# Patient Record
Sex: Male | Born: 1975 | Race: White | Hispanic: No | Marital: Married
Health system: Midwestern US, Academic
[De-identification: ages and names within clinical notes are randomized; demographics above are authoritative.]

---

## 2014-06-03 ENCOUNTER — Encounter (HOSPITAL_COMMUNITY): Payer: Self-pay

## 2014-06-03 ENCOUNTER — Emergency Department (INDEPENDENT_AMBULATORY_CARE_PROVIDER_SITE_OTHER): Payer: BLUE CROSS/BLUE SHIELD

## 2014-06-03 ENCOUNTER — Emergency Department (INDEPENDENT_AMBULATORY_CARE_PROVIDER_SITE_OTHER)
Admission: EM | Admit: 2014-06-03 | Discharge: 2014-06-03 | Disposition: A | Discharge status: Home / Self Care | Payer: BLUE CROSS/BLUE SHIELD | Source: Home / Self Care | Attending: Emergency Medicine | Admitting: Emergency Medicine

## 2014-06-03 DIAGNOSIS — M779 Enthesopathy, unspecified: Secondary | ICD-10-CM

## 2014-06-03 DIAGNOSIS — M778 Other enthesopathies, not elsewhere classified: Secondary | ICD-10-CM

## 2014-06-03 IMAGING — DX DG HAND COMPLETE 3+V*L*
3 series · 3 of 3 positions shown · non-contrast
Comparison: None.

CLINICAL DATA: Pain with bruising and swelling on the dorsal aspect
of the hand since blunt trauma 3 months ago.

EXAM:
LEFT HAND - COMPLETE 3+ VIEW

[hand pa]
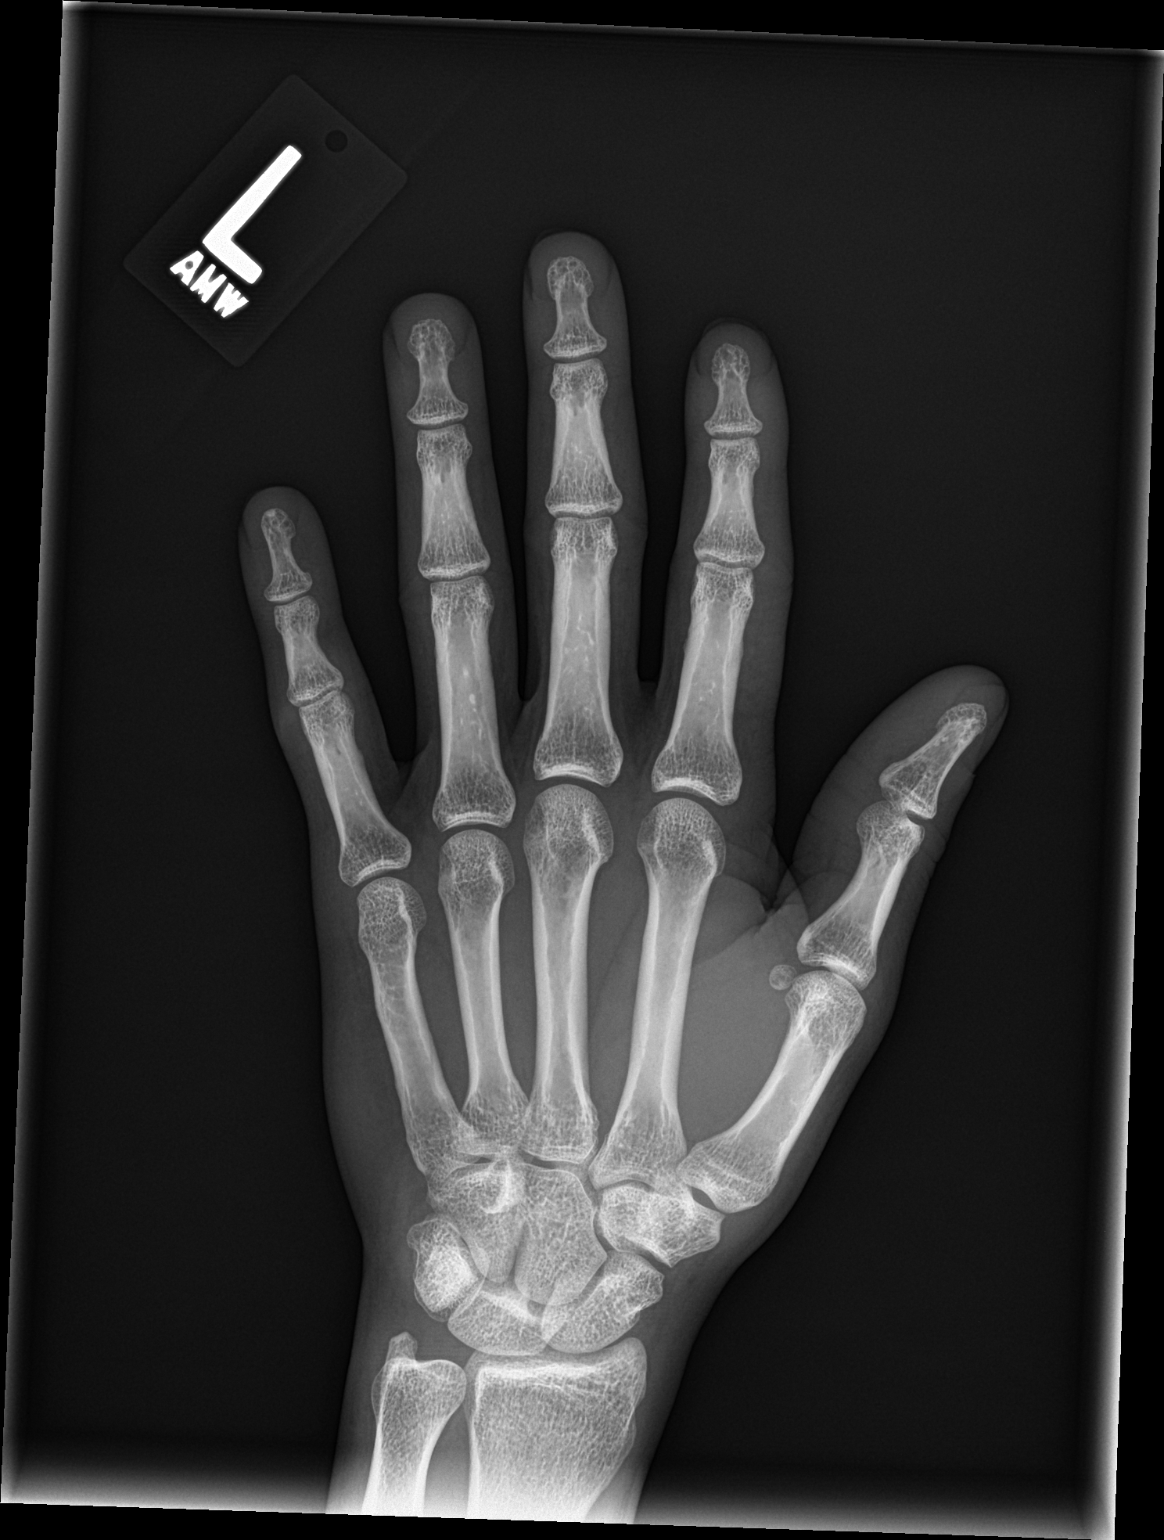

[hand obl]
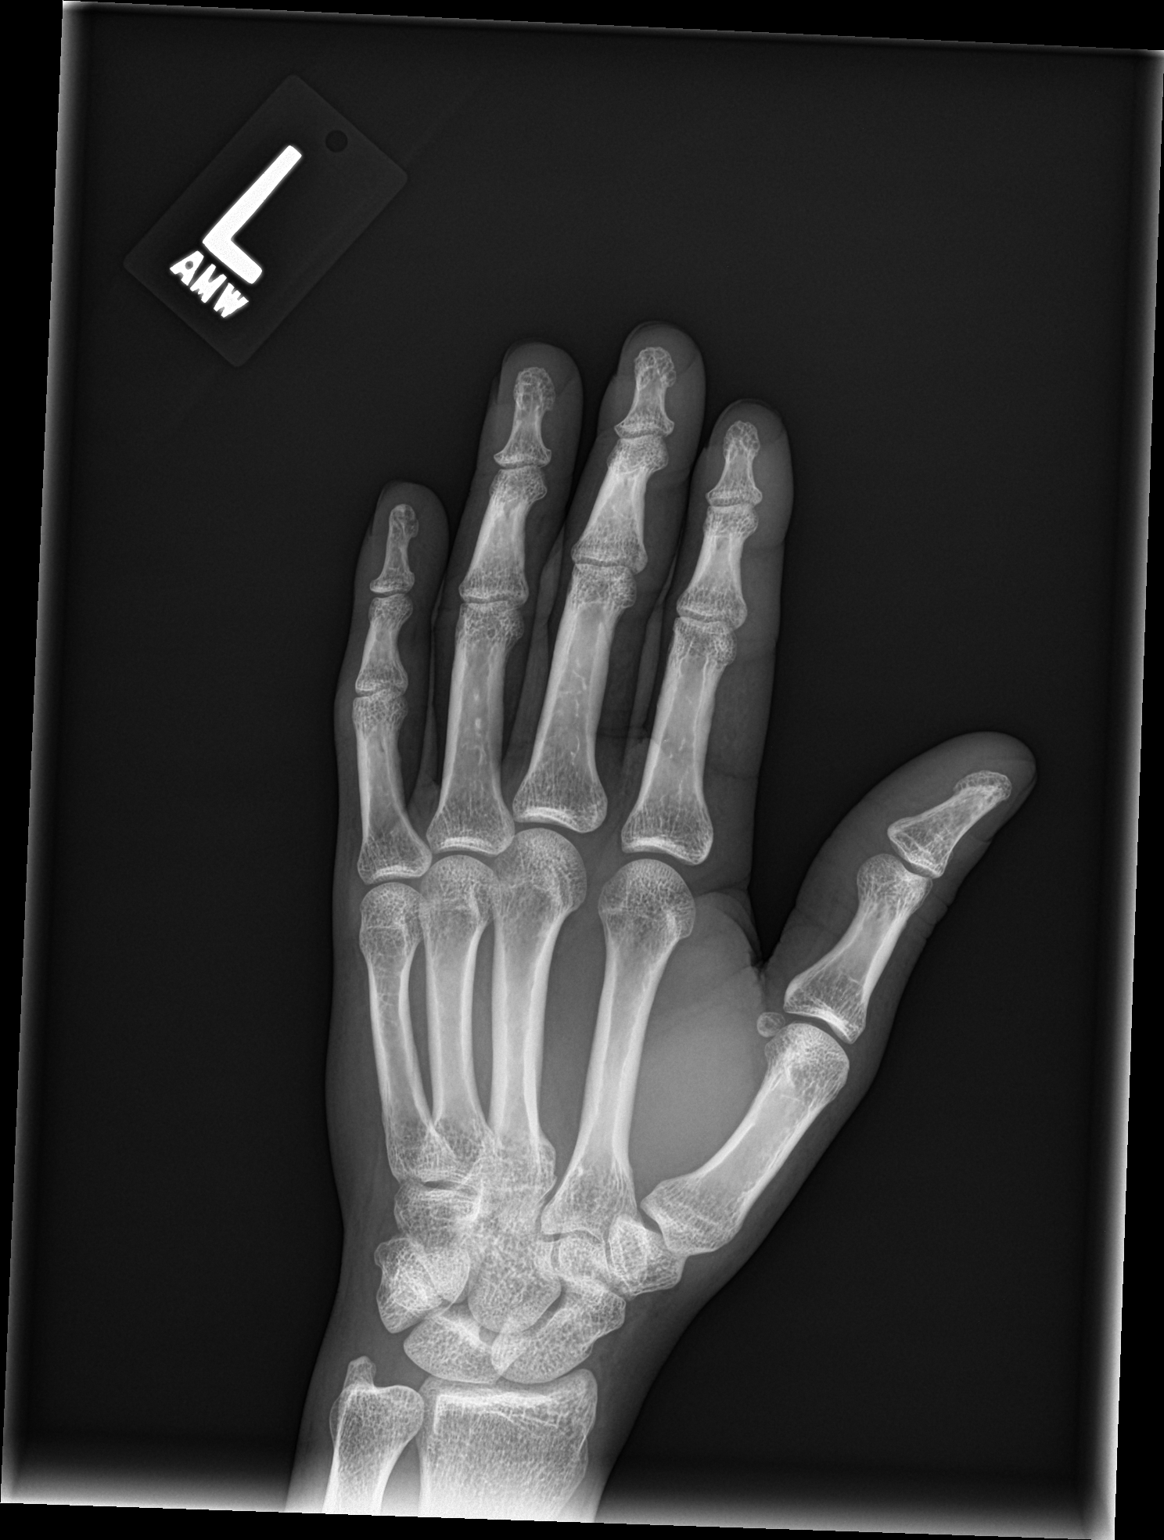

[hand lat]
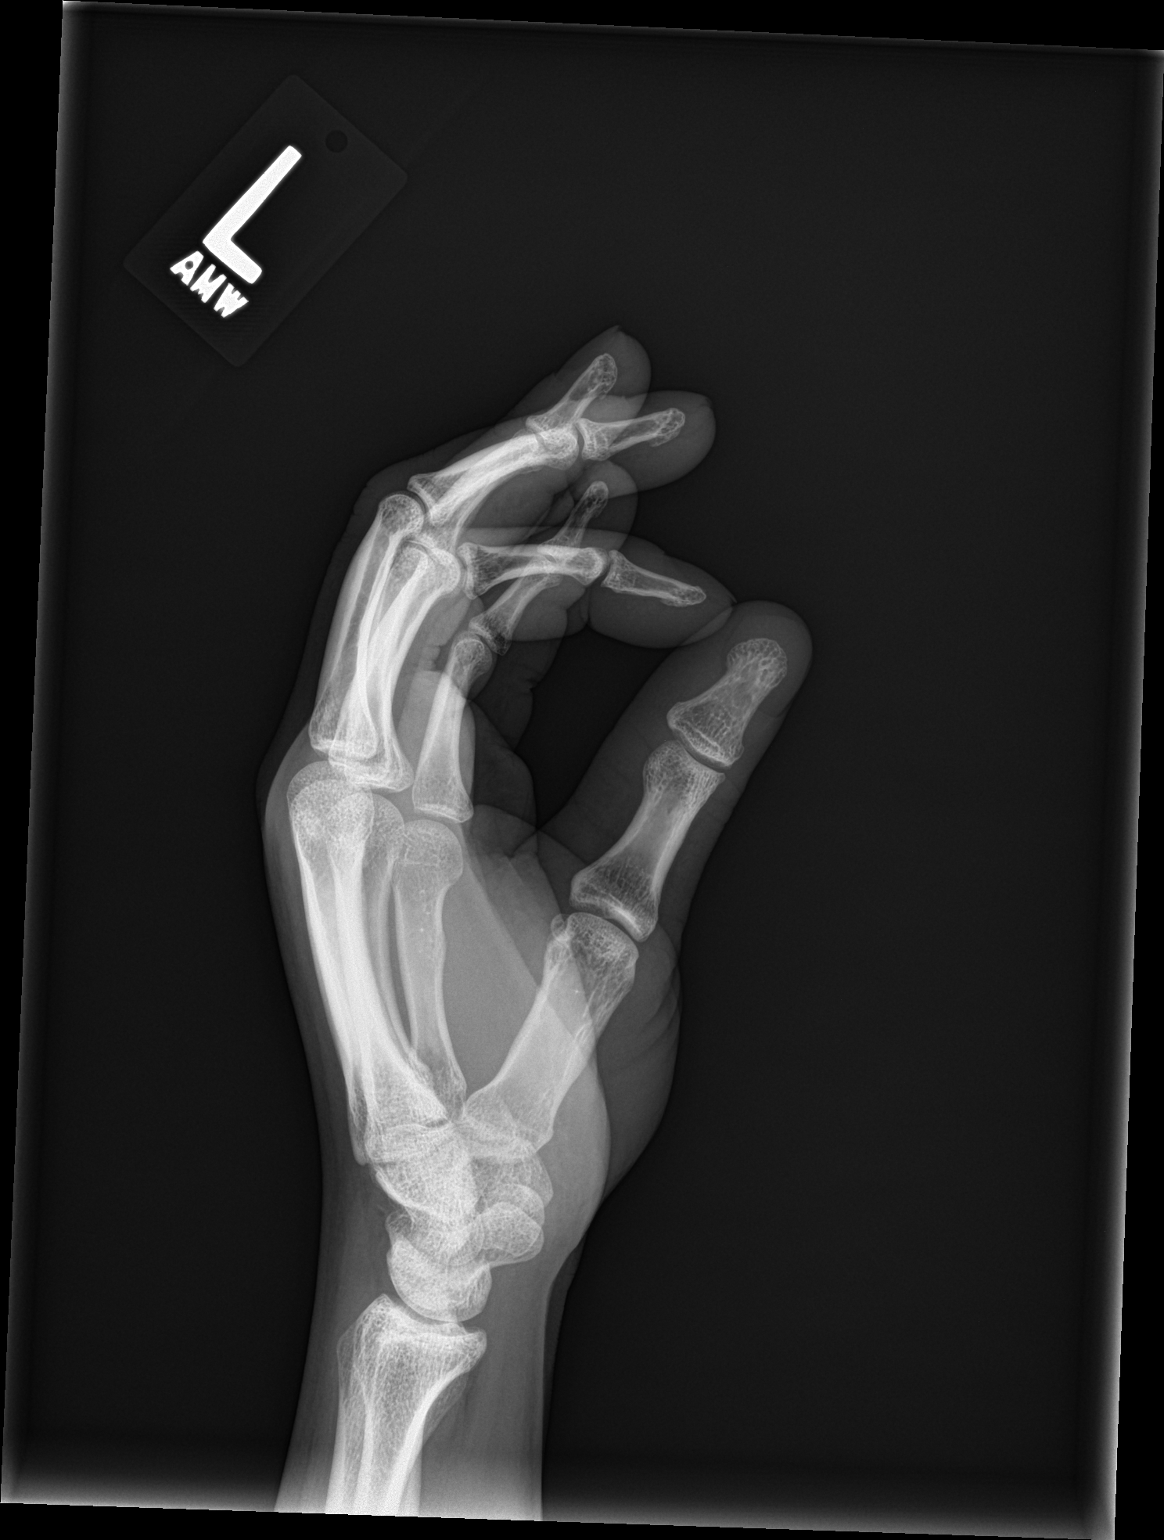

[3 of 3 positions shown; findings below may reference images not displayed]

FINDINGS: There is no evidence of fracture or dislocation. There is no
evidence of arthropathy or other focal bone abnormality. Soft
tissues are unremarkable.
IMPRESSION: Negative.

## 2014-06-03 MED ORDER — TRAMADOL HCL 50 MG PO TABS: 50.0000 mg | ORAL_TABLET | Freq: Four times a day (QID) | ORAL | Status: DC | PRN

## 2014-06-03 MED ORDER — DICLOFENAC SODIUM 75 MG PO TBEC: 75.0000 mg | DELAYED_RELEASE_TABLET | Freq: Two times a day (BID) | ORAL | Status: DC

## 2014-06-03 MED ORDER — KETOROLAC TROMETHAMINE 60 MG/2ML IM SOLN
60.0000 mg | Freq: Once | INTRAMUSCULAR | Status: AC
  Administered 2014-06-03: 60 mg via INTRAMUSCULAR

## 2014-06-03 MED ORDER — KETOROLAC TROMETHAMINE 60 MG/2ML IM SOLN
INTRAMUSCULAR | Status: AC
  Filled 2014-06-03: qty 2

## 2014-06-03 NOTE — ED Notes (Signed)
States he injured his left hand in early November. accidently struck machine at work while his hand was swinging when he was walking. Has not filed WC, and does not plan to at this point in time. C/o pain is "8" on pan scale

## 2014-06-03 NOTE — ED Notes (Signed)
Splint to finger by MD

## 2014-06-03 NOTE — Discharge Instructions (Signed)
You have tendinitis of your index finger. Wear the brace only when at work and at night. Remove it 2-3 times a day to gently flex and extend your finger. Apply ice to the area 2-3 times a day. Take diclofenac twice a day for the next week, starting this evening. Take tramadol every 6 hours as needed for severe pain. Do not take this medication while driving or at work. You should see improvement in the next 1-2 weeks.   Extensor Pollicis Longus Tendinitis Extensor pollicis longus (EPL) tendonitis is a condition in which the EPL tendon lining (sheath) becomes inflamed. This causes pain on the thumb side (radial side) of the back of the wrist. The EPL tendon attaches the EPL muscle to the bone. The EPL muscle straightens the thumb. The tendon lining secretes a lubricant that allows the EPL to glide smoothly. When the lining becomes inflamed, the tendon can not glide smoothly, which causes pain. There are three grades of EPL tendonitis. A grade 1 strain is a mild strain, where the tendon experiences a slight pull, but no obvious tearing (microscopic tearing). There is no loss of strength, and the tendon is the correct length. Grade 2 strains are a moderate strain. There is tearing of tendon fibers within the tendon or where the tendon meets the bone or muscle. Tearing of the tendon causes the length of the whole muscle-tendon-bone unit to increase. A grade 2 injury usually results in decreased strength. A grade 3 strain is a complete rupture of the tendon.  CAUSES   EPL tendinitis is often an overuse injury and caused by repeated motions of the hand and wrist, due to friction of the tendon within the lining.  Sudden increase in activity or change in activity. SYMPTOMS   Vague, diffuse pain and tenderness over the thumb side of the back of the wrist.  Pain that gets worse when straightening the thumb.  Locking, catching, or triggering of the thumb joint.  Limited motion of the thumb.  Little or  no swelling, warmth, or redness of the back of the wrist.  Crackling sound (crepitation) when the tendon or wrist is moved or touched. RISK INCREASES WITH:  Sports that involve repeated hand and wrist motions (tennis, golf, bowling).  Previous wrist injury.  Heavy labor.  Poor wrist strength and flexibility.  Failure to warm up properly before activity. PREVENTION   Warm up and stretch properly before activity.  Allow time for adequate rest and recovery between practices and competition.  Maintain physical fitness:  Forearm, wrist, and hand flexibility.  Muscle strength and endurance.  Learn and use proper exercise technique. PROGNOSIS  This condition is often curable within 6 weeks, if treated properly with non-surgical treatment and resting the affected area. Surgery may be advised. RELATED COMPLICATIONS   Longer healing time, if not properly treated, or if not given adequate time to heal.  Chronic inflammation of the EPL tendon, causing pain.  Recurring of symptoms, especially if activity is resumed too soon.  Risks of surgery: infection, bleeding, injury to nerves (numbness of the thumb), continued pain, incomplete release of the tendon lining, recurring symptoms, cutting of the tendon, thumb weakness, thumb stiffness, and inability to straighten the thumb. TREATMENT  Treatment first involves ice and medicine to reduce pain and inflammation. Stretching and strengthening exercises, along with activity modification, may be advised to reduce painful symptoms. For certain cases, a brace, splint, or taping may be advised, to reduce wrist movement during activity. Your caregiver may recommend  a corticosteroid injection to reduce inflammation. If non-surgical treatment is unsuccessful, surgery may be advised to release the inflamed tendon. If the tendon is torn, it may require repair or replacement (reconstruction). MEDICATION   If pain medicine is needed, nonsteroidal  anti-inflammatory medicines, (aspirin and ibuprofen), or other minor pain relievers (acetaminophen), are often advised.  Do not take pain medicine for 7 days before surgery.  Prescription pain relievers are usually only prescribed after surgery. Use only as directed and only as much as you need.  Cortisone injections reduce inflammation. However, these are used only in extreme cases, because there is a limit to the number of times they may be given. These injections may weaken muscle and tendon tissue. Anesthetics also temporarily relieve pain. COLD THERAPY  Cold treatment (icing) relieves pain and reduces inflammation. Cold treatment should be applied for 10 to 15 minutes every 2 to 3 hours, and immediately after activity that aggravates your symptoms. Use ice packs or an ice massage. SEEK MEDICAL CARE IF:   Symptoms get worse or do not improve in 2 to 4 weeks, despite treatment.  You experience pain, numbness, or coldness in the hand.  Blue, gray, or dark color appears in the fingernails.  Any of the following occur after surgery: increased pain, swelling, redness, drainage of fluids, bleeding in the affected area, or signs of infection.  New, unexplained symptoms develop. (Drugs used in treatment may produce side effects.) Document Released: 03/22/2005 Document Revised: 06/14/2011 Document Reviewed: 07/04/2008 [facility] Patient Information 2015 [place], [place]. This information is not intended to replace advice given to you by your health care provider. Make sure you discuss any questions you have with your health care provider.

## 2014-06-03 NOTE — ED Provider Notes (Signed)
CSN: [id number]     Arrival date & time 06/03/14  0808 History   First MD Initiated Contact with Patient 06/03/14 [phone]     Chief Complaint  Patient presents with  . Hand Problem   (Consider location/radiation/quality/duration/timing/severity/associated sxs/prior Treatment) HPI He is a 39 year old man here for evaluation of left hand injury. He states he hit it on something at work about 3 months ago. He initially had pain along the radial aspect of his index finger. Over time, the pain has moved to his second metacarpal area. He reports pain with moving his index finger. His thumb and other fingers are okay. He does note some swelling over the area. Moving his wrist causes minimal discomfort. He has tried Tylenol with minimal improvement. He states sometimes the pain is okay, and other times it is excruciating. He has difficulty opening jars and cans due to the pain. He reports some occasional tingling in his hand. He is being seen now because his insurance kicked in.  History reviewed. No pertinent past medical history. History reviewed. No pertinent past surgical history. History reviewed. No pertinent family history. History  Substance Use Topics  . Smoking status: Never Smoker   . Smokeless tobacco: Not on file  . Alcohol Use: Yes    Review of Systems As in history of present illness Allergies  Review of patient's allergies indicates no known allergies.  Home Medications   Prior to Admission medications   Medication Sig Start Date End Date Taking? Authorizing Provider  diclofenac (VOLTAREN) 75 MG EC tablet Take 1 tablet (75 mg total) by mouth 2 (two) times daily. 06/03/14   Charm Rings, MD  traMADol (ULTRAM) 50 MG tablet Take 1 tablet (50 mg total) by mouth every 6 (six) hours as needed for severe pain. 06/03/14   Charm Rings, MD   BP 108/63 mmHg  Pulse 61  Temp(Src) 98 F (36.7 C) (Oral)  Resp 14  SpO2 98% Physical Exam  Constitutional: He is oriented to person, place,  and time. He appears well-developed and well-nourished. He appears distressed (cradling hand).  Cardiovascular: Normal rate.   Pulmonary/Chest: Effort normal.  Musculoskeletal:  Left hand: No erythema. Some mild swelling over the second metacarpal on the dorsal aspect of his hand. He is tender diffusely over the dorsal hand, but worse over the first metacarpal and radial carpal bones. He has significant pain with passive flexion and extension of his index finger. Brisk cap refill in all digits. 2+ radial pulse. Sensation grossly intact to light touch.  Neurological: He is alert and oriented to person, place, and time.    ED Course  Procedures (including critical care time) Labs Review Labs Reviewed - No data to display  Imaging Review Dg Hand Complete Left  06/03/2014   CLINICAL DATA:  Pain with bruising and swelling on the dorsal aspect of the hand since blunt trauma 3 months ago.  EXAM: LEFT HAND - COMPLETE 3+ VIEW  COMPARISON:  None.  FINDINGS: There is no evidence of fracture or dislocation. There is no evidence of arthropathy or other focal bone abnormality. Soft tissues are unremarkable.  IMPRESSION: Negative.   Electronically Signed   By: Francene Boyers M.D.   On: 06/03/2014 08:48     MDM   1. Tendonitis of finger    Toradol 60 mg IM given.  X-rays negative for fracture. He likely has tendinitis of his extensor pollicis longus. Splint placed here. Conservative management with diclofenac, ice, splinting for the next week.  Prescription for tramadol provided to use as needed for severe pain. Follow-up if no improvement in 1-2 weeks.    Charm RingsErin J Aneli Zara, MD 06/03/14 [phone]

## 2015-08-26 ENCOUNTER — Ambulatory Visit
Admission: RE | Admit: 2015-08-26 | Discharge: 2015-08-26 | Disposition: A | Discharge status: Home / Self Care | Payer: BLUE CROSS/BLUE SHIELD | Source: Ambulatory Visit | Attending: Family Medicine | Admitting: Family Medicine

## 2015-08-26 ENCOUNTER — Other Ambulatory Visit: Payer: Self-pay | Admitting: Family Medicine

## 2015-08-26 DIAGNOSIS — M25561 Pain in right knee: Secondary | ICD-10-CM

## 2015-08-26 DIAGNOSIS — R2689 Other abnormalities of gait and mobility: Secondary | ICD-10-CM

## 2015-08-26 IMAGING — CR DG KNEE COMPLETE 4+V*L*
1 series · 1 of 1 positions shown · non-contrast
Comparison: None in PACs

CLINICAL DATA: Chronic left knee pain without known injury;
occasional swelling and difficulty standing.

EXAM:
LEFT KNEE - COMPLETE 4+ VIEW

[view not recorded]
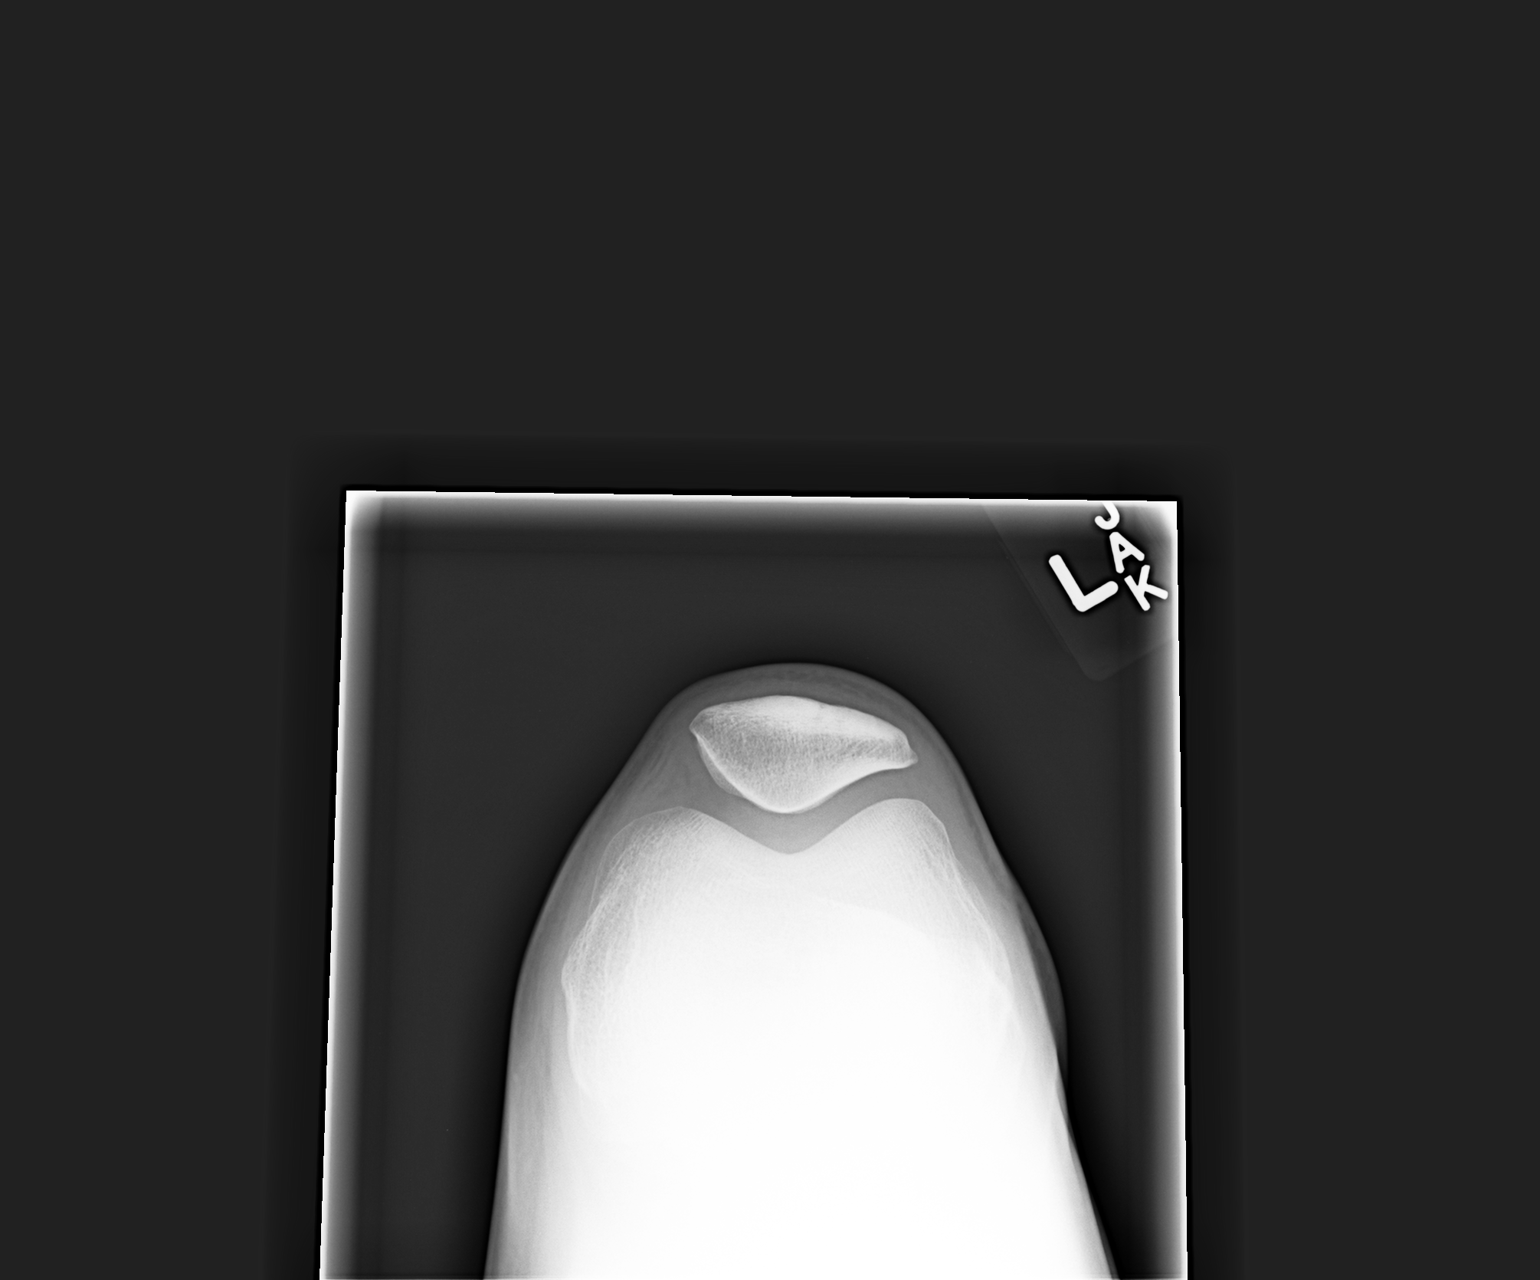

[1 of 1 positions shown; findings below may reference images not displayed]

FINDINGS: The bones are adequately mineralized. There is no acute or healing
fracture nor dislocation. There is mild narrowing of the medial
joint compartment. There is beaking of the medial tibial spine.
There is no chondrocalcinosis or joint effusion. The patella appears
appropriately positioned.
IMPRESSION: There is minimal degenerative change of the left knee centered on
the medial joint compartment.

## 2015-10-09 ENCOUNTER — Emergency Department
Admission: EM | Admit: 2015-10-09 | Discharge: 2015-10-09 | Disposition: A | Discharge status: Home / Self Care | Payer: BLUE CROSS/BLUE SHIELD | Attending: Emergency Medicine | Admitting: Emergency Medicine

## 2015-10-09 DIAGNOSIS — E869 Volume depletion, unspecified: Secondary | ICD-10-CM | POA: Diagnosis not present

## 2015-10-09 DIAGNOSIS — R55 Syncope and collapse: Secondary | ICD-10-CM

## 2015-10-09 DIAGNOSIS — Z5181 Encounter for therapeutic drug level monitoring: Secondary | ICD-10-CM | POA: Diagnosis not present

## 2015-10-09 LAB — COMPREHENSIVE METABOLIC PANEL
ALK PHOS: 57 U/L (ref 38–126)
ALT: 35 U/L (ref 17–63)
AST: 37 U/L (ref 15–41)
Albumin: 4.6 g/dL (ref 3.5–5.0)
Anion gap: 7 (ref 5–15)
BUN: 16 mg/dL (ref 6–20)
CALCIUM: 9.2 mg/dL (ref 8.9–10.3)
CHLORIDE: 105 mmol/L (ref 101–111)
CO2: 26 mmol/L (ref 22–32)
CREATININE: 0.78 mg/dL (ref 0.61–1.24)
GFR calc Af Amer: >60 mL/min (ref >60)
Glucose, Bld: 96 mg/dL (ref 65–99)
Potassium: 3.6 mmol/L (ref 3.5–5.1)
SODIUM: 138 mmol/L (ref 135–145)
Total Bilirubin: 1 mg/dL (ref 0.3–1.2)
Total Protein: 7.7 g/dL (ref 6.5–8.1)

## 2015-10-09 LAB — URINE DRUG SCREEN, QUALITATIVE (ARMC ONLY)
AMPHETAMINES, UR SCREEN: NOT DETECTED
Barbiturates, Ur Screen: NOT DETECTED
Benzodiazepine, Ur Scrn: NOT DETECTED
CANNABINOID 50 NG, UR ~~LOC~~: NOT DETECTED
COCAINE METABOLITE, UR ~~LOC~~: NOT DETECTED
MDMA (Ecstasy)Ur Screen: NOT DETECTED
Methadone Scn, Ur: NOT DETECTED
OPIATE, UR SCREEN: NOT DETECTED
PHENCYCLIDINE (PCP) UR S: NOT DETECTED
Tricyclic, Ur Screen: NOT DETECTED

## 2015-10-09 LAB — BASIC METABOLIC PANEL
ANION GAP: 4 — AB (ref 5–15)
BUN: 14 mg/dL (ref 6–20)
CHLORIDE: 110 mmol/L (ref 101–111)
CO2: 26 mmol/L (ref 22–32)
Calcium: 8.7 mg/dL — ABNORMAL LOW (ref 8.9–10.3)
Creatinine, Ser: 0.81 mg/dL (ref 0.61–1.24)
GFR calc Af Amer: >60 mL/min (ref >60)
GLUCOSE: 78 mg/dL (ref 65–99)
POTASSIUM: 4.4 mmol/L (ref 3.5–5.1)
Sodium: 140 mmol/L (ref 135–145)

## 2015-10-09 LAB — URINALYSIS COMPLETE WITH MICROSCOPIC (ARMC ONLY)
BILIRUBIN URINE: NEGATIVE
Bacteria, UA: NONE SEEN
GLUCOSE, UA: NEGATIVE mg/dL
HGB URINE DIPSTICK: NEGATIVE
KETONES UR: NEGATIVE mg/dL
LEUKOCYTES UA: NEGATIVE
Nitrite: NEGATIVE
Protein, ur: NEGATIVE mg/dL
SPECIFIC GRAVITY, URINE: 1.024 (ref 1.005–1.030)
pH: 6 (ref 5.0–8.0)

## 2015-10-09 LAB — CBC WITH DIFFERENTIAL/PLATELET
BASOS ABS: 0.1 10*3/uL (ref 0–0.1)
Basophils Relative: 2 %
EOS ABS: 0.3 10*3/uL (ref 0–0.7)
EOS PCT: 7 %
HCT: 41.4 % (ref 40.0–52.0)
Hemoglobin: 14.4 g/dL (ref 13.0–18.0)
Lymphocytes Relative: 33 %
Lymphs Abs: 1.5 10*3/uL (ref 1.0–3.6)
MCH: 31 pg (ref 26.0–34.0)
MCHC: 34.8 g/dL (ref 32.0–36.0)
MCV: 88.9 fL (ref 80.0–100.0)
Monocytes Absolute: 0.4 10*3/uL (ref 0.2–1.0)
Monocytes Relative: 8 %
Neutro Abs: 2.3 10*3/uL (ref 1.4–6.5)
Neutrophils Relative %: 50 %
PLATELETS: 206 10*3/uL (ref 150–440)
RBC: 4.65 MIL/uL (ref 4.40–5.90)
RDW: 12.8 % (ref 11.5–14.5)
WBC: 4.5 10*3/uL (ref 3.8–10.6)

## 2015-10-09 LAB — ETHANOL: Alcohol, Ethyl (B): <5 mg/dL (ref <5)

## 2015-10-09 LAB — CK
CK TOTAL: 912 U/L — AB (ref 49–397)
Total CK: 790 U/L — ABNORMAL HIGH (ref 49–397)

## 2015-10-09 MED ORDER — SODIUM CHLORIDE 0.9 % IV BOLUS (SEPSIS)
1000.0000 mL | INTRAVENOUS | Status: AC
  Administered 2015-10-09: 1000 mL via INTRAVENOUS

## 2015-10-09 MED ORDER — SODIUM CHLORIDE 0.9 % IV BOLUS (SEPSIS)
1000.0000 mL | Freq: Once | INTRAVENOUS | Status: AC
  Administered 2015-10-09: 1000 mL via INTRAVENOUS

## 2015-10-09 NOTE — ED Notes (Signed)
Pt [facility] ED [facility] [place] where [place] 15 hour days. Pt states he clocked in at 3:55 and wasn't feel well but "brushed it off", stated he felt sick and wanted to go home. Pt stated that he put a wet towel on his head, sat down in a chair, EMS states that someone helped him down to the floor so that there was no head trauma. Pt does not remember passing out. Pt is alert and oriented upon arrival, EMS states upon arrival of them to pts work he was lethargic and "out of it". EMS states that pt works 15 hr days, ate pancakes and eggs for breakfast. HR was in 50's with EMS, BP 130/80, CBG 91. Pt not c/o of any pain. Pt is stating at this time that he feels lightheaded.

## 2015-10-09 NOTE — ED Provider Notes (Signed)
[facility] Emergency Department Provider Note  ____________________________________________  Time seen: Approximately 6:30 AM  I have reviewed the triage vital signs and the nursing notes.   HISTORY  Chief Complaint Near Syncope    HPI Eduardo Jacobs is a 40 y.o. male who denies any chronic medical problems who presents by EMS for evaluation of near syncope . He works long 15+ hour days in the heat and has not been sleeping well.  He got to work early this morning and a co-worker observed him nearly collapse and almost pass out.  No complete LOC, no traumatic injuries.  EMS reports normal FSBS and vitals but patient reported feeling lightheaded and seemed a little "off" initially although EMS reports he seems better now.  He states he thinks he is just dehydrated and tired.  Denies fever/chills, CP, SOB, abd pain, N/V/D, visual changes, weakness/numbness/tingling in his extremities.  Denies any alcohol consumption and the use of any illicit drugs.  Non-smoker.  No cardiac history.   History reviewed. No pertinent past medical history.  There are no active problems to display for this patient.   History reviewed. No pertinent past surgical history.  Current Outpatient Rx  Name  Route  Sig  Dispense  Refill  . diclofenac (VOLTAREN) 75 MG EC tablet   Oral   Take 1 tablet (75 mg total) by mouth 2 (two) times daily.   30 tablet   0   . traMADol (ULTRAM) 50 MG tablet   Oral   Take 1 tablet (50 mg total) by mouth every 6 (six) hours as needed for severe pain.   20 tablet   0     Allergies Review of patient's allergies indicates no known allergies.  History reviewed. No pertinent family history.  Social History Social History  Substance Use Topics  . Smoking status: Never Smoker   . Smokeless tobacco: None  . Alcohol Use: Yes    Review of Systems Constitutional: No fever/chills Eyes: No visual changes. ENT: No sore throat. Cardiovascular:  Denies chest pain. Respiratory: Denies shortness of breath. Gastrointestinal: No abdominal pain.  No nausea, no vomiting.  No diarrhea.  No constipation. Genitourinary: Negative for dysuria. Musculoskeletal: Negative for back pain. Skin: Negative for rash. Neurological: Negative for headaches, focal weakness or numbness.  Lightheaded, near syncopal episode  10-point ROS otherwise negative.  ____________________________________________   PHYSICAL EXAM:  ED Triage Vitals  Enc Vitals Group     BP 10/09/15 0640 110/78 mmHg     Pulse Rate 10/09/15 0640 56     Resp 10/09/15 0640 18     Temp 10/09/15 0640 97.8 F (36.6 C)     Temp Source 10/09/15 0640 Oral     SpO2 10/09/15 0640 100 %     Weight 10/09/15 0640 143 lb (64.864 kg)     Height 10/09/15 0640 5\' 7"  (1.702 m)     Head Cir --      Peak Flow --      Pain Score 10/09/15 0640 0     Pain Loc --      Pain Edu? --      Excl. in GC? --      Constitutional: Alert and oriented. Well appearing and in no acute distress. Eyes: Conjunctivae are normal. PERRL. EOMI. Head: Atraumatic. Nose: No congestion/rhinnorhea. Mouth/Throat: Mucous membranes are moist.  Oropharynx non-erythematous. Neck: No stridor.  No meningeal signs.   Cardiovascular: Normal rate, regular rhythm. Good peripheral circulation. Grossly normal heart  sounds.   Respiratory: Normal respiratory effort.  No retractions. Lungs CTAB. Gastrointestinal: Soft and nontender. No distention.  Musculoskeletal: No lower extremity tenderness nor edema. No gross deformities of extremities. Neurologic:  Normal speech and language. No gross focal neurologic deficits are appreciated.  Skin:  Skin is warm, dry and intact. No rash noted. Psychiatric: Odd affect but generally appropriate, unclear if this is his baseline, but AOx3  ____________________________________________   LABS (all labs ordered are listed, but only abnormal results are displayed)  Labs Reviewed  CK -  Abnormal; Notable for the following:    Total CK 912 (*)    All other components within normal limits  URINALYSIS COMPLETEWITH MICROSCOPIC ([facility] ONLY) - Abnormal; Notable for the following:    Color, Urine YELLOW (*)    APPearance CLEAR (*)    Squamous Epithelial / LPF 0-5 (*)    All other components within normal limits  CBC WITH DIFFERENTIAL/PLATELET  COMPREHENSIVE METABOLIC PANEL  ETHANOL  URINE DRUG SCREEN, QUALITATIVE ([facility] ONLY)   ____________________________________________  EKG  ED ECG REPORT I, Thorn Demas, the attending physician, personally viewed and interpreted this ECG.  Date: 10/09/2015 EKG Time: 06:31 Rate: 54 Rhythm: sinus bradycardia QRS Axis: normal Intervals: normal ST/T Wave abnormalities: slightly elevation of ST segments anterior most consistent with early repol pattern Conduction Disturbances: none Narrative Interpretation: unremarkable  ____________________________________________  RADIOLOGY   No results found.  ____________________________________________   PROCEDURES  Procedure(s) performed:   Procedures   ____________________________________________   INITIAL IMPRESSION / ASSESSMENT AND PLAN / ED COURSE  Pertinent labs & imaging results that were available during my care of the patient were reviewed by me and considered in my medical decision making (see chart for details).  Probably mild dehydration / volume depletion.  VSS, afebrile, reassuring EKG.  No indication to check troponin given no risk factors, no CP, no SOB.  Will check lytes, CBC, CK, and give 1L NS and reassess. No indication for head CT at this time.  ----------------------------------------- 7:26 AM on 10/09/2015 -----------------------------------------  Transferring care to Dr. Don PerkingVeronese to follow up labs and reassess.  ____________________________________________  FINAL CLINICAL IMPRESSION(S) / ED DIAGNOSES  Final diagnoses:  Near syncope  Volume  depletion     MEDICATIONS GIVEN DURING THIS VISIT:  Medications  sodium chloride 0.9 % bolus 1,000 mL (1,000 mLs Intravenous New Bag/Given 10/09/15 0646)     NEW OUTPATIENT MEDICATIONS STARTED DURING THIS VISIT:  New Prescriptions   No medications on file      Note:  This document was prepared using Dragon voice recognition software and may include unintentional dictation errors.   Loleta Roseory Zorawar Strollo, MD 10/09/15 [phone]

## 2015-10-09 NOTE — ED Provider Notes (Signed)
-----------------------------------------   8:35 AM on 10/09/2015 -----------------------------------------   Blood pressure 110/78, pulse 56, temperature 97.8 F (36.6 C), temperature source Oral, resp. rate 18, height 5\' 7"  (1.702 m), weight 143 lb (64.864 kg), SpO2 100 %.  Assuming care from Dr. York CeriseForbach.  In short, Eduardo Jacobs is a 40 y.o. male with a chief complaint of Near Syncope .  Refer to the original H&P for additional details.  The current plan of care is to follow-up labs and reassess for possible dehydration or rhabdo.  ----------------------------------------- 8:36 AM on 10/09/2015 -----------------------------------------  Labs showing a CK of 912, normal urinalysis revealed no protein, normal kidney function. We'll give patient another liter of fluid and repeat CK and creatinine. If Creatinine is stable and CK trending down with no evidence of rhabdo we'll discharge patient home.   ----------------------------------------- 1:28 PM on 10/09/2015 ----------------------------------------- CK trending down. Kidney function remains normal. The patient is tolerating by mouth. His wife is here and he will be discharged to her care. Recommended him take today off from work and increasing his by mouth hydration at home. Patient will follow-up with his primary care doctor in 2 days. Discussed return precautions with patient.   I discussed my evaluation of the patient's symptoms, my clinical impression, and my proposed outpatient treatment plan with patient/ family members. We have discussed anticipatory guidance, scheduled follow-up, and careful return precautions. The patient expresses understanding and is comfortable with the discharge plan. All patient's questions were answered.     Nita Sicklearolina Jessieca Rhem, MD 10/09/15 1329

## 2015-10-09 NOTE — Discharge Instructions (Signed)
You have been seen today in the Emergency Department (ED)  for near syncope (almost passing out).  Your workup including labs and EKG show reassuring results.  Your symptoms may be due to dehydration, so it is important that you drink plenty of non-alcoholic fluids.  Please call your regular doctor as soon as possible to schedule the next available clinic appointment to follow up with him/her regarding your visit to the ED and your symptoms.  Return to the Emergency Department (ED)  if you have any further syncopal episodes (pass out again) or develop ANY chest pain, pressure, tightness, trouble breathing, sudden sweating, or other symptoms that concern you.   Near-Syncope Near-syncope (commonly known as near fainting) is sudden weakness, dizziness, or feeling like you might pass out. During an episode of near-syncope, you may also develop pale skin, have tunnel vision, or feel sick to your stomach (nauseous). Near-syncope may occur when getting up after sitting or while standing for a long time. It is caused by a sudden decrease in blood flow to the brain. This decrease can result from various causes or triggers, most of which are not serious. However, because near-syncope can sometimes be a sign of something serious, a medical evaluation is required. The specific cause is often not determined. HOME CARE INSTRUCTIONS  Monitor your condition for any changes. The following actions may help to alleviate any discomfort you are experiencing:  Have someone stay with you until you feel stable.  Lie down right away and prop your feet up if you start feeling like you might faint. Breathe deeply and steadily. Wait until all the symptoms have passed. Most of these episodes last only a few minutes. You may feel tired for several hours.   Drink enough fluids to keep your urine clear or pale yellow.   If you are taking blood pressure or heart medicine, get up slowly when seated or lying down. Take several  minutes to sit and then stand. This can reduce dizziness.  Follow up with your health care provider as directed. SEEK IMMEDIATE MEDICAL CARE IF:   You have a severe headache.   You have unusual pain in the chest, abdomen, or back.   You are bleeding from the mouth or rectum, or you have black or tarry stool.   You have an irregular or very fast heartbeat.   You have repeated fainting or have seizure-like jerking during an episode.   You faint when sitting or lying down.   You have confusion.   You have difficulty walking.   You have severe weakness.   You have vision problems.  MAKE SURE YOU:   Understand these instructions.  Will watch your condition.  Will get help right away if you are not doing well or get worse.   This information is not intended to replace advice given to you by your health care provider. Make sure you discuss any questions you have with your health care provider.   Document Released: 03/22/2005 Document Revised: 03/27/2013 Document Reviewed: 08/25/2012 Elsevier Interactive Patient Education [employer]

## 2017-08-19 IMAGING — CR DG KNEE COMPLETE 4+V*L*
3 series · 3 of 3 positions shown · non-contrast
Comparison: None in PACs

CLINICAL DATA: Chronic left knee pain without known injury;
occasional swelling and difficulty standing.

EXAM:
LEFT KNEE - COMPLETE 4+ VIEW

[w knee obl. left (1 of 2)]
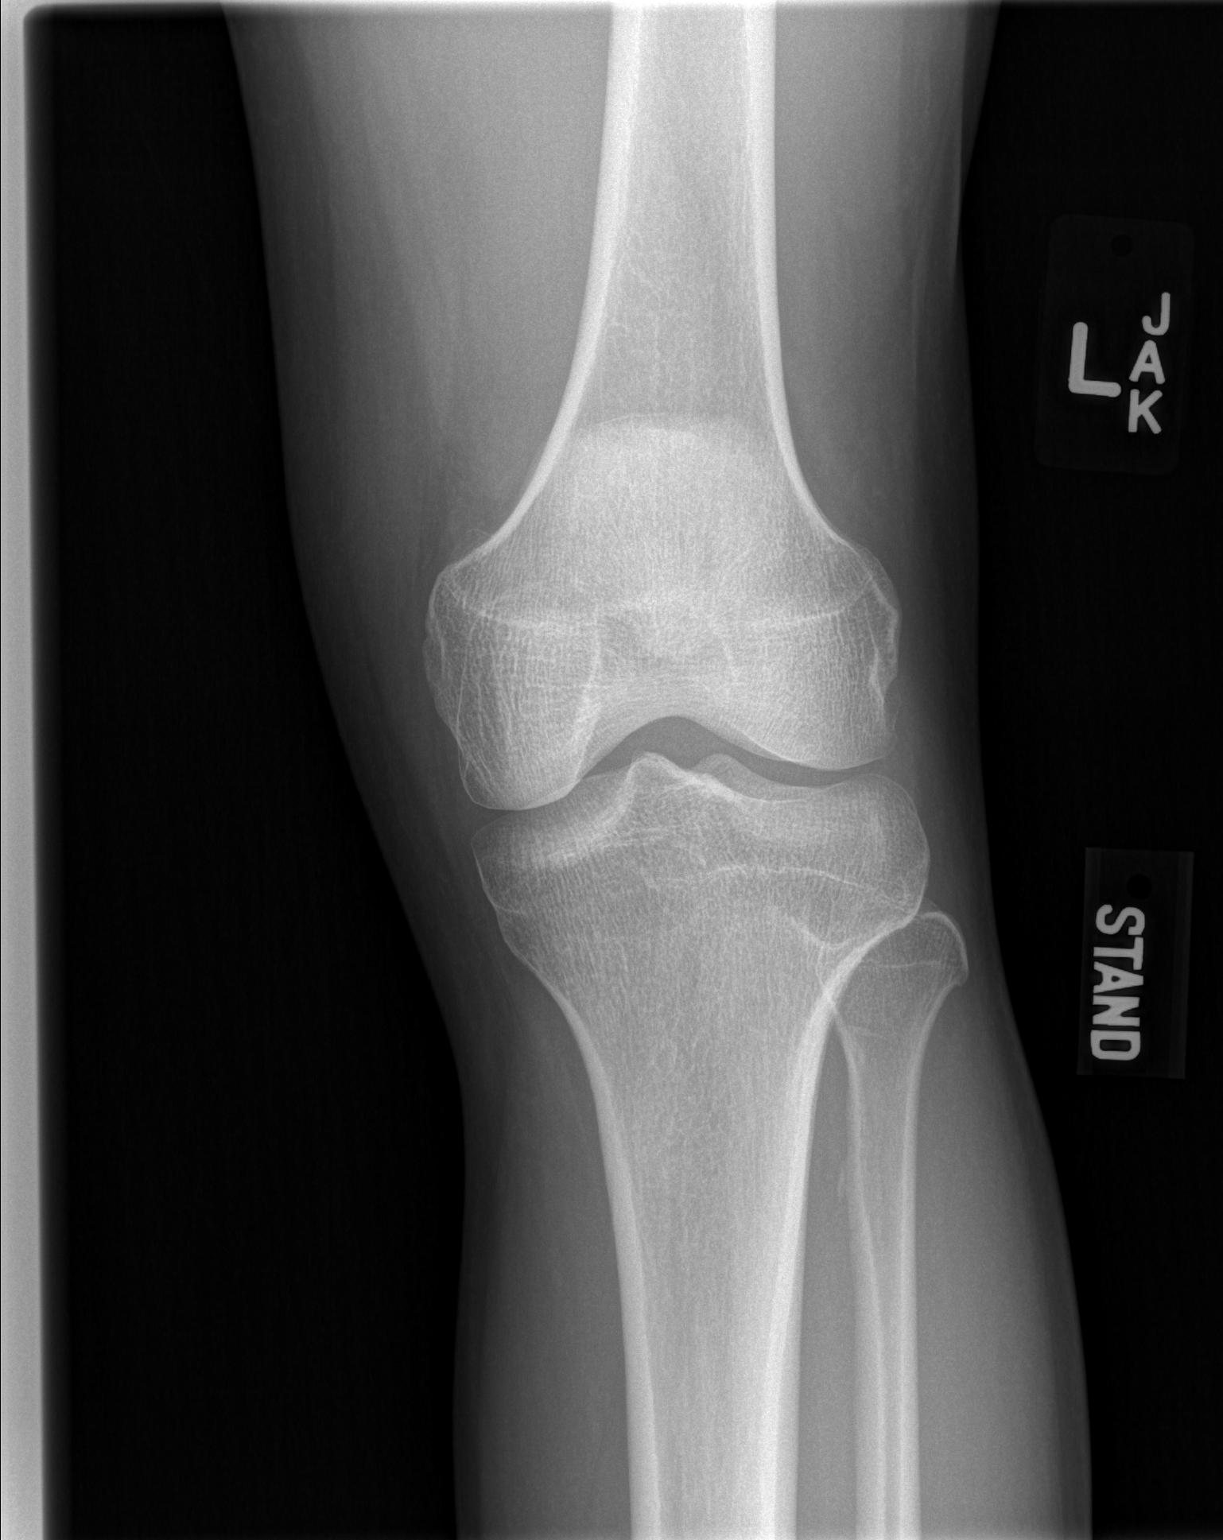

[w knee obl. left (2 of 2)]
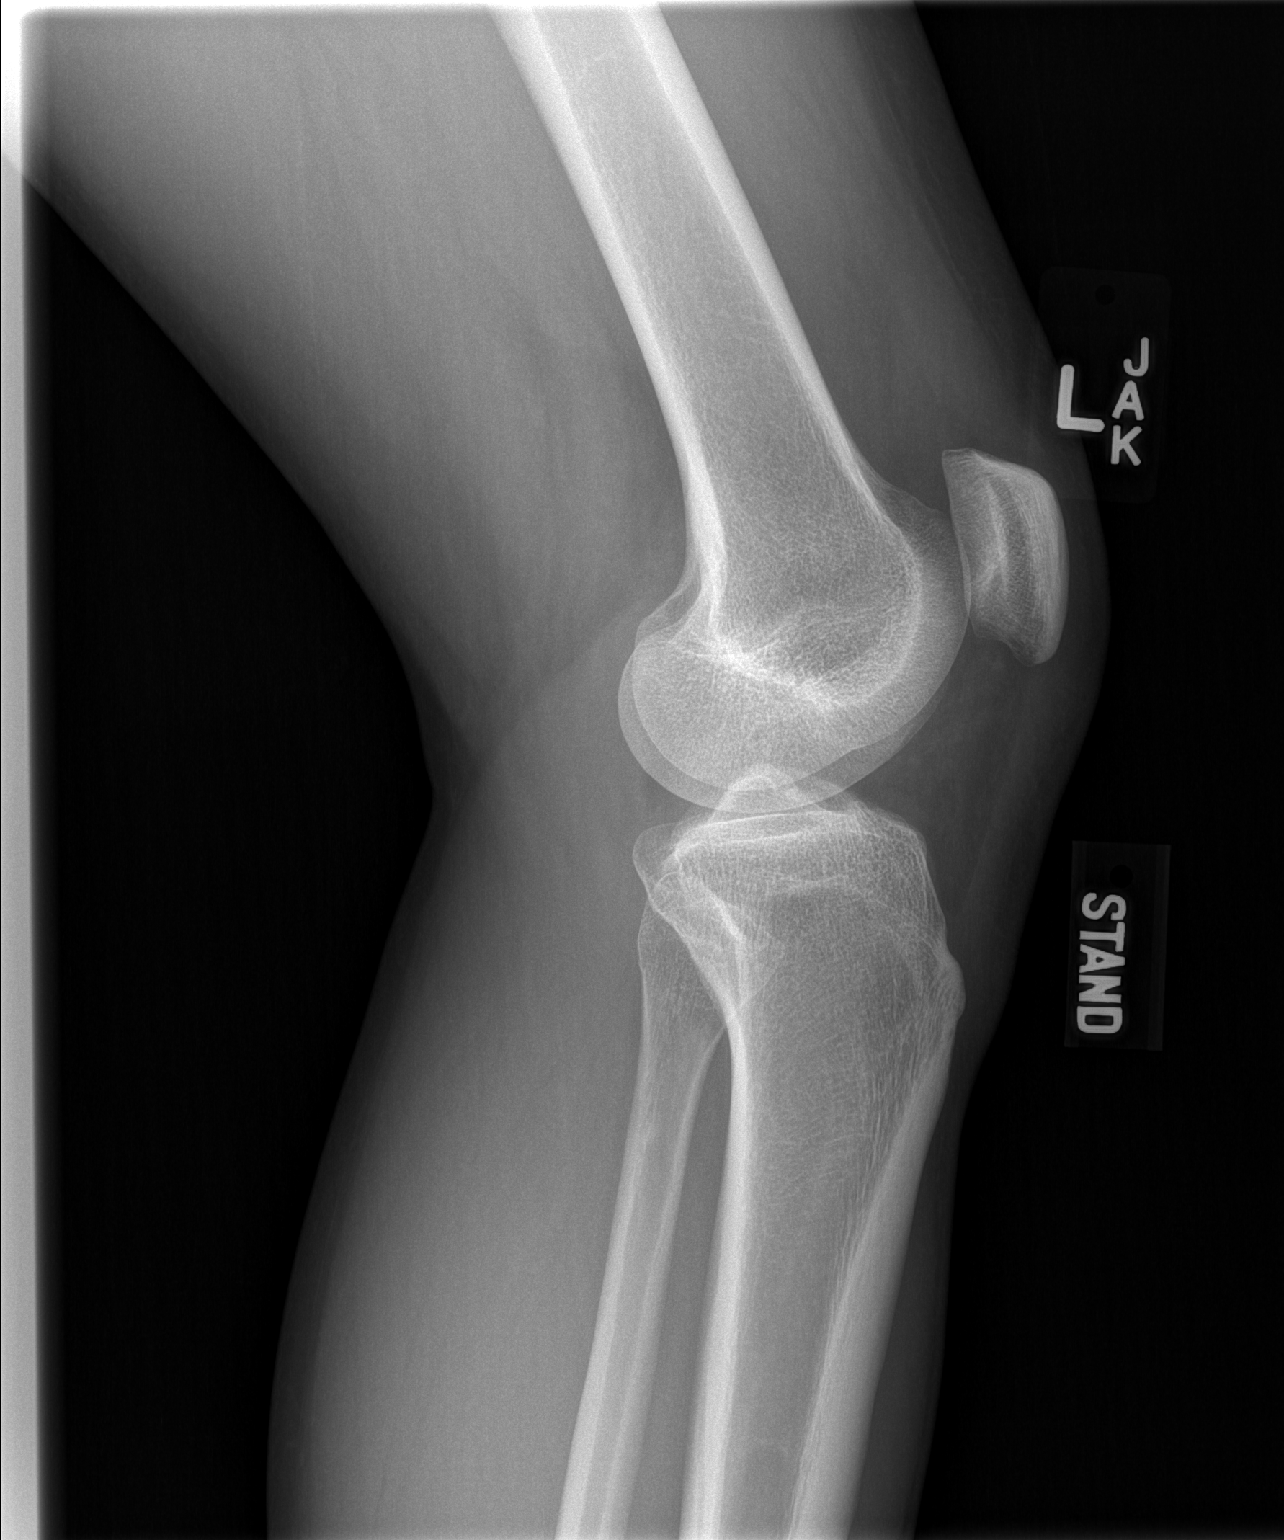

[w knee lat. left]
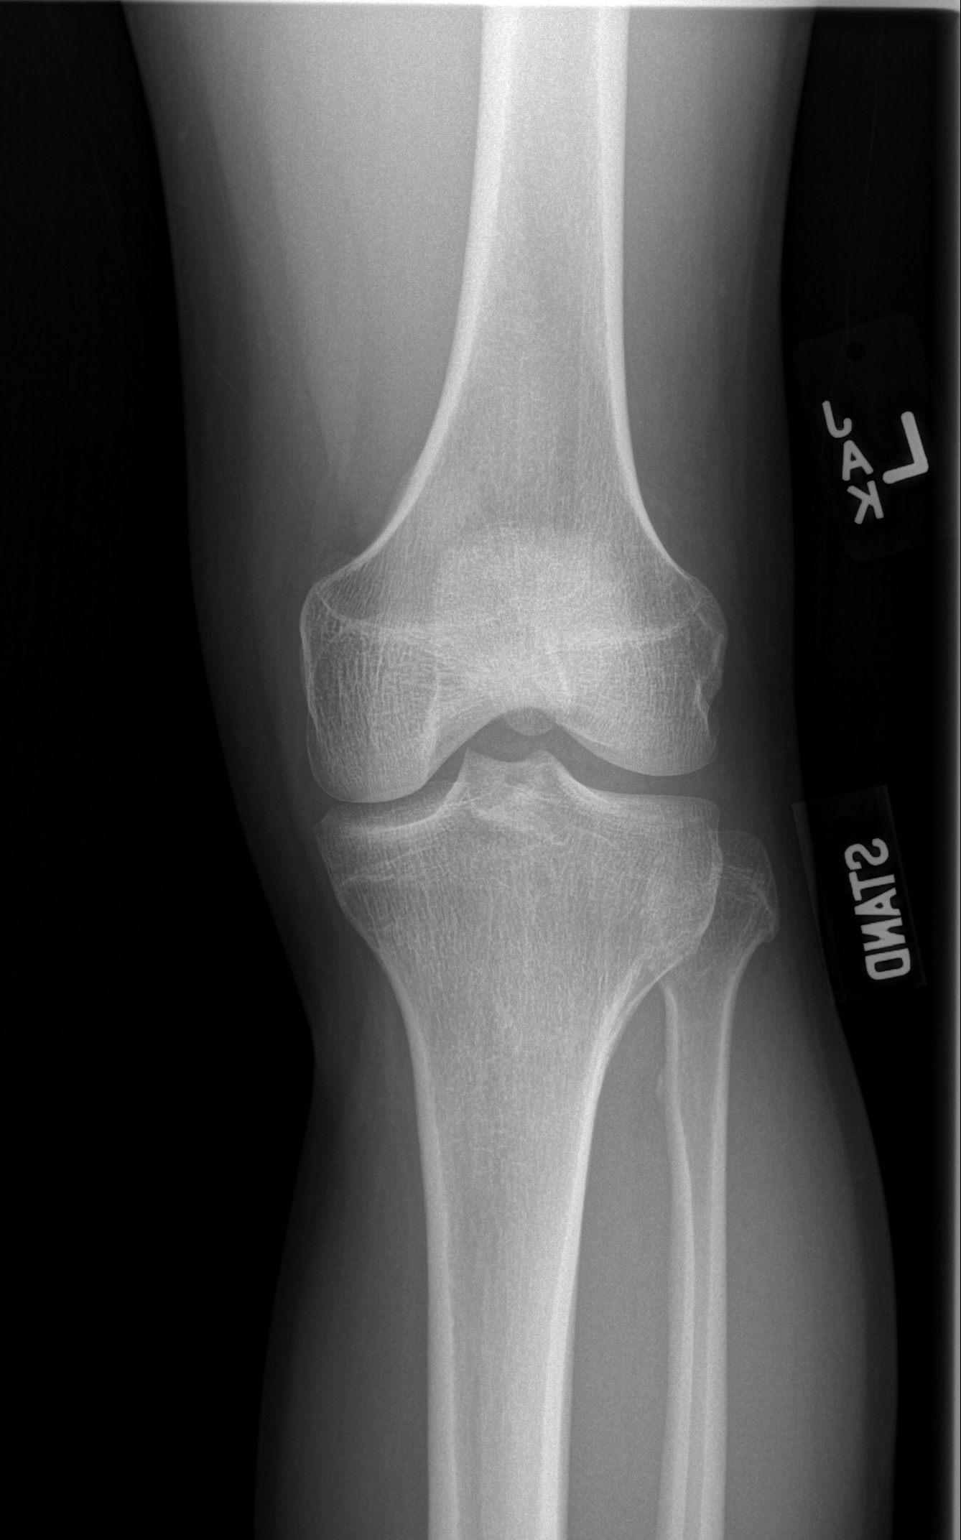

[3 of 3 positions shown; findings below may reference images not displayed]

FINDINGS: The bones are adequately mineralized. There is no acute or healing
fracture nor dislocation. There is mild narrowing of the medial
joint compartment. There is beaking of the medial tibial spine.
There is no chondrocalcinosis or joint effusion. The patella appears
appropriately positioned.
IMPRESSION: There is minimal degenerative change of the left knee centered on
the medial joint compartment.

## 2017-08-25 ENCOUNTER — Inpatient Hospital Stay: Payer: PRIVATE HEALTH INSURANCE

## 2017-08-26 ENCOUNTER — Inpatient Hospital Stay
Admit: 2017-08-26 | Discharge: 2017-08-26 | Disposition: A | Discharge status: Home / Self Care | Payer: PRIVATE HEALTH INSURANCE

## 2017-08-26 DIAGNOSIS — R221 Localized swelling, mass and lump, neck: Secondary | ICD-10-CM

## 2017-08-26 MED ORDER — AMPicillin-sulbactam (UNASYN) 3 g in sodium chloride 0.9% 100 mL IVPB
Freq: Four times a day (QID) | INTRAVENOUS | Status: AC
Start: 2017-08-26 — End: 2017-08-26
  Administered 2017-08-26: 10:00:00 3 g via INTRAVENOUS

## 2017-08-26 MED ORDER — HYDROcodone-acetaminophen (NORCO) 5-325 mg per tablet
5-325 | ORAL_TABLET | ORAL | 0 refills | 15.50000 days | Status: AC
Start: 2017-08-26 — End: ?
  Filled 2017-08-26: qty 12, 3d supply, fill #0

## 2017-08-26 MED ORDER — AMOXicillin-clavulanate (AUGMENTIN) 875-125 mg per tablet
875-125 | ORAL_TABLET | Freq: Two times a day (BID) | ORAL | 0 refills | Status: AC
Start: 2017-08-26 — End: 2017-09-02
  Filled 2017-08-26: qty 14, 7d supply, fill #0

## 2017-08-26 MED ORDER — ibuprofen (ADVIL,MOTRIN) 600 MG tablet
600 | ORAL_TABLET | ORAL | 0 refills | Status: AC
Start: 2017-08-26 — End: ?
  Filled 2017-08-26: qty 30, 7d supply, fill #0

## 2017-08-26 MED ORDER — chlorhexidine (PERIDEX) 0.12 % solution
0.12 | 0 refills | 16.00000 days | Status: AC
Start: 2017-08-26 — End: ?
  Filled 2017-08-26: qty 473, 22d supply, fill #0

## 2017-08-26 MED FILL — AMPICILLIN-SULBACTAM 3 GRAM INTRAVENOUS SOLUTION: 3 3 gram | INTRAVENOUS | Qty: 3

## 2017-08-26 NOTE — ED Provider Notes (Signed)
[facility] Note    Reason for Visit: Oral Swelling      Patient History     HPI: Jonathan Franco is a 42 y.o. male who presents as a transfer from [facility] with right submandibular phlegmon possible abscess.  Initially noticed symptoms on Friday, about a week ago.  Has been getting progressively more painful and swollen.  No fevers or chills.  Does have some trismus.  No difficulty swallowing or breathing.  Was given Decadron and clindamycin per review of records that were sent with the patient.    CT from [facility]   1.Inflammatory changes in the right submandibular and sublingual/submental region which may represent cellulitis with a questionable low-attenuation area in the sublingual region which may represent a developing phlegmon.   2.Questionable lucency involving what appears to be tooth #32 which may represent an odontogenic infectious etiology. Correlation with direct visualization is suggested.        With the exception of the above, there are no aggravating or alleviating factors.    Medical History: No past medical history on file.    Surgical History: No past surgical history on file.    Medications: No current facility-administered medications for this encounter.   No current outpatient prescriptions on file.    Social History: Jonathan Franco  has no tobacco, alcohol, and drug history on file.    Allergies:   Allergies as of 08/25/2017    (Not on File)        Review of Systems     Review of Systems   Constitutional: Negative for chills and fever.   Respiratory: Negative for cough and shortness of breath.    Cardiovascular: Negative for chest pain and palpitations.   Gastrointestinal: Negative for abdominal pain, diarrhea, nausea and vomiting.   Genitourinary: Negative for dysuria and flank pain.   Musculoskeletal: Negative for joint pain and myalgias.   Neurological: Negative for dizziness, focal weakness and headaches.      Psychiatric/Behavioral: Negative for suicidal ideas.   All other systems reviewed and are negative.    Physical Exam     There were no vitals filed for this visit.    General: 42 y.o. male in no apparent distress, well developed, well nourished, non-toxic appearance.     HEENT: Atraumatic, normocephalic. EOMs intact. right facial and neck swelling, 2 finger trismus, fullness in the right submandibular region, floor of the mouth soft, protecting airway, no drooling, voice normal    Neck:  Full range of motion.     Chest/pulm: Respiratory rate normal. Speaks in complete sentences, no respiratory distress, lungs CTA bilaterally, no wheezes, rales, rhonchi.     Cardiovascular: Heart rate normal. RRR, no murmurs, rubs, gallops appreciated.     Abdomen: No gross distension. Soft, non-tender, non-peritoneal. No suprapubic or CVAT.     GU: Deferred.     Musculoskeletal: Ambulates without difficulty.  No evident Franco bone or joint deformity.      Neuro: A&O x 4. Normal speech without dysarthria or aphasia. Moves all extremities spontaneously and symmetrically. Gait normal without ataxia.     Skin: Warm, dry. No obvious rashes, petechiae, or purpura.     Psych: Appropriate mood and affect, normal interaction.               Diagnostic Studies     Labs: Please see electronic medical record for any tests performed in the ED   Labs Reviewed - No data to display    IMAGING STUDIES /  RADIOLOGY: Please see electronic medical record for any tests performed in the ED  No orders to display     Emergency Department Procedures     Procedures  None    ED Course     ED Medications Administered:  Medications - No data to display    CONSULTS:  None    MDM   Briefly, Jonathan Franco is a 42 y.o. male who presents to the emergency department with facial swelling as a transfer from [place], accepted by oral surgery. On presentation, patient is well-appearing and in no acute distress. On exam the patient has evident right submandibular  swelling, 2 finger trismus, but protecting his airway.  Images were obtained.  Oral surgery was consulted. VS are not complete yet    Oral surgery has requested changed to Unasyn every 6, nothing by mouth, will be discharged to the clinic tomorrow morning at 8 AM.     Images are viewable at:    [email or web address]    [email or web address]    Password: [email or web address]    The patient was evaluated by myself and the ED Attending Physician, Dr. Loleta Chance All management and disposition plans were discussed and agreed upon.       Maricela Bo [place], [place]  08/26/17 [phone]

## 2017-08-26 NOTE — ED Triage Notes (Signed)
Pt arrives via [facility] accepted by oral surgery. Pt seen at OSH for right sided swelling. Was originally referred by dentist.

## 2017-08-26 NOTE — ED Notes (Signed)
Pt being discharged at this time with rx x1. Upon asking pt stated no other needs or concerns

## 2017-08-26 NOTE — ED Notes (Signed)
Bedside report received from Kelly, RN

## 2017-08-26 NOTE — ED Notes (Signed)
Bedside report received from Jenny, RN

## 2017-08-26 NOTE — Consults (Signed)
Oral and Maxillofacial Surgery Consult Note    Date of Consult:  08/26/2017  Patient's Primary Care Physician:  No Pcp  Requesting Service:  ED  Reason for Consultation: Facial Infection     Name: Jonathan Franco  Age: 42 y.o.  Sex: male      Subjective   History of Present Illness  Pt presents as a transfer from [place] to be evaluation of right side neck swelling, trismus and dysphagia. Pt reports chronic issues with his LR md dentition. Pt was in the middle of obtaining a RCT on # 31. Pt reported that his LR swelling started past Thursday and worsened , not responding to PO clindamycin and PO flagyl both rx on May 17, 21 st respectively by pt's dentist. Pt reports odynophagia, otherwise is stable   Denies any fevers, chills.     Patient denies dyspnea, dysphagia, dysphonia and is tolerating sections.     Objective     Past Medical History:  No past medical history on file.    Past Surgical History:  No past surgical history on file.    Allergies:  No Known Allergies    Home Medications:  Prior to Admission medications    Not on File        Scheduled Meds:  Continuous Infusions:  PRN Meds:    Social History:  Social History   Substance Use Topics    Smoking status: Not on file    Smokeless tobacco: Not on file    Alcohol use Not on file       Physical Exam  There were no vitals filed for this visit.  No intake or output data in the 24 hours ending 08/26/17 0306    Constitutional/Gen: AAOx3, NAD, Resting comfortably.  Neuro: CN II-XII grossly intact, no acute motor/sensory deficits  Head/Face: mild right submandiular facial swelling soft TTP, non induratd   Eyes: EOMIx2, PERRLx2, visual acuity grossly intact  Ear: external auditory canals are unremarkable with no signs of exudate.  Nose: nares patent bilaterally with no drainage noted.  Throat/Oropharynx:  trismus, (Passive MIO:32 mm, Active MIO:35mm), right lingual aspect of the # 32 site mild swelling, TTP,   # 32 palpable, covered by soft tissue, no discharge ,  # 31 crown intact, TTP, No FOME, FOM soft and supple, FOM right submandibular site TTP,  No palatal draping, no deviation of the uvula.  Neck: supple, tracheal midline, no subcuataneous emphysema, no swelling/induration, non-TTP.  Pulmonary:   CTAB, no increased work of breathing   Cardiac:  RRR, normal S1/S2, no m/r/g  Abdomen:  Soft, nt/nd, nabs  Extremities: 2+ radial pulses, no deformity, swelling, tenderness   Skin:  No rashes or bruising,    Psych:  Mood and affect appropriate for situation      Relevant data reviewed:        Imaging Studies:  EXAMINATION: CT-NECK SOFT TISSUE W CONTRAST     DATE OF EXAM: 08/25/2017 9:25 PM    DEMOGRAPHICS: 42 years old Male     INDICATION:     Contrast utilized and relevant clinical information: History: presumed tooth #32 abscess with significant right submental and neck swelling. Number of Series/Images: 6. 80ml ISOVUE 370 76 % injection.     COMPARISON: No existing relevant imaging study corresponding to the same anatomical region is available.    TECHNIQUE: Contiguous axial slices of the neck were submitted after the IV administration of contrast. Additional angled images were submitted.     DOSE OPTIMIZATION:  CT radiation dose optimization techniques (automated exposure control, and use of iterative reconstruction techniques, or adjustment of the mA and/or kV according to patient size) were used to limit patient radiation dose.    FINDINGS:  CT NECK:     Paranasal sinuses: The visualized paranasal sinuses are clear.    Mastoid air cells: The visualized mastoid air cells are clear.    Orbits: The visualized portions of the orbits have a normal contrasted appearance.    Intracranial: The visualized intracranial structures are normal.    Nasopharynx: The nasopharynx is within normal limits.    Oropharynx: The oropharynx is within normal limits    Hypopharynx: The hypopharynx is within normal limits.    Glottic structures: The glottic structures appear normal.    Subglottic  airway: The subglottic airway appears within normal limits.    Parotid glands: The parotid glands are normal without evidence for a sialolith.    Submandibular glands: The left submandibular gland is normal without evidence for a sialolith. Inflammatory changes in the right submandibular space is seen which appears to involve the right sublingual space. A questionable low-attenuation area in the right sublingual space that he present measuring approximately 8 mm in size. The right subinsular gland appears to be slightly enlarged with inflammatory changes.    Thyroid: The thyroid gland is homogeneous in attenuation.    Vasculature:  ICA: Normal appearance bilaterally  CCA: Normal appearance bilaterally  Vertebral arteries: Normal appearance bilaterally  Other: The remainder of the vascular structures are normal caliber. There is no evidence for significant focal stenosis or aneurysmal dilatation. There is normal contrast opacification.    Lymphatic system: Slightly prominent right submental lymph nodes are seen in the level 1B region which may be reactive from the surrounding inflammatory changes. No other enlarged lymph nodes are suspected.    Soft tissues: Inflammatory changes in the right submandibular region is seen which may also partially involve the right submental region.    Upper lungs: The visualized upper lung fields are clear.    Superior mediastinum: The visualized portions of the superior mediastinum are normal.    Osseous structures: A questionable lucency of what appears to be tooth #32 may be present. The remaining osseous structures are within normal limits.    IMPRESSION:   1. Inflammatory changes in the right submandibular and sublingual/submental region which may represent cellulitis with a questionable low-attenuation area in the sublingual region which may represent a developing phlegmon.  2. Questionable lucency involving what appears to be tooth #32 which may represent an odontogenic  infectious etiology. Correlation with direct visualization is suggested.EXAMINATION: CT-NECK SOFT TISSUE W CONTRAST     DATE OF EXAM: 08/25/2017 9:25 PM        Assessment & Plan     Assessment  This is a 42 y.o. male with mild right submandibular tenderness from possible carious # 31 or # 32.     Plan(s):  o Appreciate pain management per primary team  o IV unasyn q6 hr scheduled   o warm compresses TID  o NPO  o Follow up with [facility] as an outpatient tomorrow in the AM    Sign:  Johann Capers DDS  Oral and Maxillofacial Surgery  08/26/2017  3:06 AM

## 2017-08-26 NOTE — Unmapped (Signed)
Go directly to [facility].

## 2017-08-26 NOTE — ED Provider Notes (Signed)
ED Attending Attestation Note    Date of service:  08/26/2017    This patient was seen by the advanced practice provider.  I have seen and examined the patient, agree with the workup, evaluation, management and diagnosis. The care plan has been discussed and I concur.     My assessment reveals a 42 y.o. male who presents to the Emergency Department with complaints of oral swelling.  The patient was transfer from outside hospital for a submandibular phlegmon and possible abscess accepted by oral surgery.  On examination the patient is protecting his airway no evidence of any drooling no evidence of any labored respirations
